# Patient Record
Sex: Male | Born: 1945 | Race: White | Hispanic: No | Marital: Married
Health system: Southern US, Community
[De-identification: ages and names within clinical notes are randomized; demographics above are authoritative.]

## PROBLEM LIST (undated history)

## (undated) DIAGNOSIS — E119 Type 2 diabetes mellitus without complications: Secondary | ICD-10-CM

## (undated) DIAGNOSIS — I10 Essential (primary) hypertension: Secondary | ICD-10-CM

---

## 2020-02-08 ENCOUNTER — Encounter (HOSPITAL_COMMUNITY): Payer: Self-pay | Admitting: Emergency Medicine

## 2020-02-08 ENCOUNTER — Emergency Department (HOSPITAL_COMMUNITY): Payer: No Typology Code available for payment source

## 2020-02-08 ENCOUNTER — Emergency Department (HOSPITAL_COMMUNITY)
Admission: EM | Admit: 2020-02-08 | Discharge: 2020-02-08 | Payer: No Typology Code available for payment source | Attending: Emergency Medicine | Admitting: Emergency Medicine

## 2020-02-08 ENCOUNTER — Other Ambulatory Visit: Payer: Self-pay

## 2020-02-08 DIAGNOSIS — S9001XA Contusion of right ankle, initial encounter: Secondary | ICD-10-CM | POA: Insufficient documentation

## 2020-02-08 DIAGNOSIS — R079 Chest pain, unspecified: Secondary | ICD-10-CM | POA: Insufficient documentation

## 2020-02-08 DIAGNOSIS — J320 Chronic maxillary sinusitis: Secondary | ICD-10-CM | POA: Insufficient documentation

## 2020-02-08 DIAGNOSIS — S1091XA Abrasion of unspecified part of neck, initial encounter: Secondary | ICD-10-CM | POA: Diagnosis not present

## 2020-02-08 DIAGNOSIS — I10 Essential (primary) hypertension: Secondary | ICD-10-CM | POA: Insufficient documentation

## 2020-02-08 DIAGNOSIS — T07XXXA Unspecified multiple injuries, initial encounter: Secondary | ICD-10-CM

## 2020-02-08 DIAGNOSIS — S0081XA Abrasion of other part of head, initial encounter: Secondary | ICD-10-CM | POA: Diagnosis present

## 2020-02-08 DIAGNOSIS — S0990XA Unspecified injury of head, initial encounter: Secondary | ICD-10-CM | POA: Diagnosis not present

## 2020-02-08 DIAGNOSIS — E119 Type 2 diabetes mellitus without complications: Secondary | ICD-10-CM | POA: Diagnosis not present

## 2020-02-08 DIAGNOSIS — S8012XA Contusion of left lower leg, initial encounter: Secondary | ICD-10-CM | POA: Insufficient documentation

## 2020-02-08 DIAGNOSIS — S80811A Abrasion, right lower leg, initial encounter: Secondary | ICD-10-CM | POA: Insufficient documentation

## 2020-02-08 HISTORY — DX: Type 2 diabetes mellitus without complications: E11.9

## 2020-02-08 HISTORY — DX: Essential (primary) hypertension: I10

## 2020-02-08 LAB — BASIC METABOLIC PANEL
Anion gap: 10 (ref 5–15)
BUN: 27 mg/dL — ABNORMAL HIGH (ref 8–23)
CO2: 21 mmol/L — ABNORMAL LOW (ref 22–32)
Calcium: 9.3 mg/dL (ref 8.9–10.3)
Chloride: 103 mmol/L (ref 98–111)
Creatinine, Ser: 1.27 mg/dL — ABNORMAL HIGH (ref 0.61–1.24)
GFR, Estimated: >60 mL/min (ref >60)
Glucose, Bld: 261 mg/dL — ABNORMAL HIGH (ref 70–99)
Potassium: 4.1 mmol/L (ref 3.5–5.1)
Sodium: 134 mmol/L — ABNORMAL LOW (ref 135–145)

## 2020-02-08 LAB — CBC
HCT: 40.3 % (ref 39.0–52.0)
Hemoglobin: 13 g/dL (ref 13.0–17.0)
MCH: 30.1 pg (ref 26.0–34.0)
MCHC: 32.3 g/dL (ref 30.0–36.0)
MCV: 93.3 fL (ref 80.0–100.0)
Platelets: 201 10*3/uL (ref 150–400)
RBC: 4.32 MIL/uL (ref 4.22–5.81)
RDW: 13.2 % (ref 11.5–15.5)
WBC: 9.2 10*3/uL (ref 4.0–10.5)
nRBC: 0 % (ref 0.0–0.2)

## 2020-02-08 MED ORDER — ACETAMINOPHEN 325 MG PO TABS
650.0000 mg | ORAL_TABLET | Freq: Once | ORAL | Status: AC
  Administered 2020-02-08: 650 mg via ORAL
  Filled 2020-02-08: qty 2

## 2020-02-08 NOTE — ED Notes (Signed)
Pt returned to room from CT
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
## 2020-02-08 NOTE — ED Notes (Signed)
Patient discharged into [facility] custody.
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
## 2020-02-08 NOTE — ED Notes (Signed)
Patient's daughter provided update.
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
## 2020-06-29 ENCOUNTER — Telehealth: Payer: Self-pay | Admitting: *Deleted

## 2020-06-29 NOTE — Telephone Encounter (Signed)
TOC CM received call from pt (verified his ss# and phone number) and request his labwork be sent to Dr. Laneta Simmers, [facility] fax # [phone]. Faxed labwork to Dr Kathi Der office. Isidoro Donning RN CCM, [facility] ED TOC CM [phone]
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
